# Patient Record
Sex: Male | Born: 1980 | Race: Black or African American | Hispanic: No | Marital: Single | Smoking: Never smoker
Health system: Southern US, Community
[De-identification: ages and names within clinical notes are randomized; demographics above are authoritative.]

---

## 1998-08-09 ENCOUNTER — Emergency Department (HOSPITAL_COMMUNITY): Admission: EM | Admit: 1998-08-09 | Discharge: 1998-08-09 | Payer: Self-pay | Admitting: Emergency Medicine

## 2002-05-22 ENCOUNTER — Emergency Department (HOSPITAL_COMMUNITY): Admission: EM | Admit: 2002-05-22 | Discharge: 2002-05-22 | Payer: Self-pay | Admitting: Emergency Medicine

## 2002-05-22 ENCOUNTER — Encounter: Payer: Self-pay | Admitting: Emergency Medicine

## 2015-06-30 DIAGNOSIS — J029 Acute pharyngitis, unspecified: Secondary | ICD-10-CM | POA: Diagnosis not present

## 2015-06-30 DIAGNOSIS — Z9289 Personal history of other medical treatment: Secondary | ICD-10-CM | POA: Diagnosis not present

## 2015-07-02 DIAGNOSIS — J069 Acute upper respiratory infection, unspecified: Secondary | ICD-10-CM | POA: Diagnosis not present

## 2015-08-19 DIAGNOSIS — L218 Other seborrheic dermatitis: Secondary | ICD-10-CM | POA: Diagnosis not present

## 2015-08-19 DIAGNOSIS — B360 Pityriasis versicolor: Secondary | ICD-10-CM | POA: Diagnosis not present

## 2015-10-05 DIAGNOSIS — Z0000 Encounter for general adult medical examination without abnormal findings: Secondary | ICD-10-CM | POA: Diagnosis not present

## 2015-10-06 DIAGNOSIS — Z23 Encounter for immunization: Secondary | ICD-10-CM | POA: Diagnosis not present

## 2015-10-06 DIAGNOSIS — Z0000 Encounter for general adult medical examination without abnormal findings: Secondary | ICD-10-CM | POA: Diagnosis not present

## 2015-10-06 DIAGNOSIS — E7800 Pure hypercholesterolemia, unspecified: Secondary | ICD-10-CM | POA: Diagnosis not present

## 2016-05-21 DIAGNOSIS — Z0000 Encounter for general adult medical examination without abnormal findings: Secondary | ICD-10-CM | POA: Diagnosis not present

## 2016-07-12 DIAGNOSIS — E7800 Pure hypercholesterolemia, unspecified: Secondary | ICD-10-CM | POA: Diagnosis not present

## 2016-07-12 DIAGNOSIS — E669 Obesity, unspecified: Secondary | ICD-10-CM | POA: Diagnosis not present

## 2016-09-04 DIAGNOSIS — M9901 Segmental and somatic dysfunction of cervical region: Secondary | ICD-10-CM | POA: Diagnosis not present

## 2016-09-04 DIAGNOSIS — M542 Cervicalgia: Secondary | ICD-10-CM | POA: Diagnosis not present

## 2016-09-04 DIAGNOSIS — M2560 Stiffness of unspecified joint, not elsewhere classified: Secondary | ICD-10-CM | POA: Diagnosis not present

## 2016-09-04 DIAGNOSIS — R293 Abnormal posture: Secondary | ICD-10-CM | POA: Diagnosis not present

## 2016-09-14 DIAGNOSIS — M9901 Segmental and somatic dysfunction of cervical region: Secondary | ICD-10-CM | POA: Diagnosis not present

## 2016-09-14 DIAGNOSIS — M2560 Stiffness of unspecified joint, not elsewhere classified: Secondary | ICD-10-CM | POA: Diagnosis not present

## 2016-09-14 DIAGNOSIS — R293 Abnormal posture: Secondary | ICD-10-CM | POA: Diagnosis not present

## 2016-09-14 DIAGNOSIS — M542 Cervicalgia: Secondary | ICD-10-CM | POA: Diagnosis not present

## 2016-09-17 DIAGNOSIS — M2560 Stiffness of unspecified joint, not elsewhere classified: Secondary | ICD-10-CM | POA: Diagnosis not present

## 2016-09-17 DIAGNOSIS — R293 Abnormal posture: Secondary | ICD-10-CM | POA: Diagnosis not present

## 2016-09-17 DIAGNOSIS — M542 Cervicalgia: Secondary | ICD-10-CM | POA: Diagnosis not present

## 2016-09-17 DIAGNOSIS — M9901 Segmental and somatic dysfunction of cervical region: Secondary | ICD-10-CM | POA: Diagnosis not present

## 2016-09-19 DIAGNOSIS — M2560 Stiffness of unspecified joint, not elsewhere classified: Secondary | ICD-10-CM | POA: Diagnosis not present

## 2016-09-19 DIAGNOSIS — M9901 Segmental and somatic dysfunction of cervical region: Secondary | ICD-10-CM | POA: Diagnosis not present

## 2016-09-19 DIAGNOSIS — R293 Abnormal posture: Secondary | ICD-10-CM | POA: Diagnosis not present

## 2016-09-19 DIAGNOSIS — M542 Cervicalgia: Secondary | ICD-10-CM | POA: Diagnosis not present

## 2016-09-21 DIAGNOSIS — M9901 Segmental and somatic dysfunction of cervical region: Secondary | ICD-10-CM | POA: Diagnosis not present

## 2016-09-21 DIAGNOSIS — M542 Cervicalgia: Secondary | ICD-10-CM | POA: Diagnosis not present

## 2016-09-21 DIAGNOSIS — R293 Abnormal posture: Secondary | ICD-10-CM | POA: Diagnosis not present

## 2016-09-21 DIAGNOSIS — M2560 Stiffness of unspecified joint, not elsewhere classified: Secondary | ICD-10-CM | POA: Diagnosis not present

## 2016-09-24 DIAGNOSIS — R293 Abnormal posture: Secondary | ICD-10-CM | POA: Diagnosis not present

## 2016-09-24 DIAGNOSIS — M9901 Segmental and somatic dysfunction of cervical region: Secondary | ICD-10-CM | POA: Diagnosis not present

## 2016-09-24 DIAGNOSIS — M2560 Stiffness of unspecified joint, not elsewhere classified: Secondary | ICD-10-CM | POA: Diagnosis not present

## 2016-09-24 DIAGNOSIS — M542 Cervicalgia: Secondary | ICD-10-CM | POA: Diagnosis not present

## 2016-09-26 DIAGNOSIS — M2560 Stiffness of unspecified joint, not elsewhere classified: Secondary | ICD-10-CM | POA: Diagnosis not present

## 2016-09-26 DIAGNOSIS — M9901 Segmental and somatic dysfunction of cervical region: Secondary | ICD-10-CM | POA: Diagnosis not present

## 2016-09-26 DIAGNOSIS — M542 Cervicalgia: Secondary | ICD-10-CM | POA: Diagnosis not present

## 2016-09-26 DIAGNOSIS — R293 Abnormal posture: Secondary | ICD-10-CM | POA: Diagnosis not present

## 2016-09-28 DIAGNOSIS — M9901 Segmental and somatic dysfunction of cervical region: Secondary | ICD-10-CM | POA: Diagnosis not present

## 2016-09-28 DIAGNOSIS — R293 Abnormal posture: Secondary | ICD-10-CM | POA: Diagnosis not present

## 2016-09-28 DIAGNOSIS — M542 Cervicalgia: Secondary | ICD-10-CM | POA: Diagnosis not present

## 2016-09-28 DIAGNOSIS — M2560 Stiffness of unspecified joint, not elsewhere classified: Secondary | ICD-10-CM | POA: Diagnosis not present

## 2016-10-01 DIAGNOSIS — R293 Abnormal posture: Secondary | ICD-10-CM | POA: Diagnosis not present

## 2016-10-01 DIAGNOSIS — M9901 Segmental and somatic dysfunction of cervical region: Secondary | ICD-10-CM | POA: Diagnosis not present

## 2016-10-01 DIAGNOSIS — M2560 Stiffness of unspecified joint, not elsewhere classified: Secondary | ICD-10-CM | POA: Diagnosis not present

## 2016-10-01 DIAGNOSIS — M542 Cervicalgia: Secondary | ICD-10-CM | POA: Diagnosis not present

## 2016-10-03 DIAGNOSIS — M9901 Segmental and somatic dysfunction of cervical region: Secondary | ICD-10-CM | POA: Diagnosis not present

## 2016-10-03 DIAGNOSIS — M542 Cervicalgia: Secondary | ICD-10-CM | POA: Diagnosis not present

## 2016-10-03 DIAGNOSIS — R293 Abnormal posture: Secondary | ICD-10-CM | POA: Diagnosis not present

## 2016-10-03 DIAGNOSIS — M2560 Stiffness of unspecified joint, not elsewhere classified: Secondary | ICD-10-CM | POA: Diagnosis not present

## 2016-10-05 DIAGNOSIS — M9901 Segmental and somatic dysfunction of cervical region: Secondary | ICD-10-CM | POA: Diagnosis not present

## 2016-10-05 DIAGNOSIS — R293 Abnormal posture: Secondary | ICD-10-CM | POA: Diagnosis not present

## 2016-10-05 DIAGNOSIS — M2560 Stiffness of unspecified joint, not elsewhere classified: Secondary | ICD-10-CM | POA: Diagnosis not present

## 2016-10-05 DIAGNOSIS — M542 Cervicalgia: Secondary | ICD-10-CM | POA: Diagnosis not present

## 2016-10-08 DIAGNOSIS — M542 Cervicalgia: Secondary | ICD-10-CM | POA: Diagnosis not present

## 2016-10-08 DIAGNOSIS — R293 Abnormal posture: Secondary | ICD-10-CM | POA: Diagnosis not present

## 2016-10-08 DIAGNOSIS — M2560 Stiffness of unspecified joint, not elsewhere classified: Secondary | ICD-10-CM | POA: Diagnosis not present

## 2016-10-08 DIAGNOSIS — M9901 Segmental and somatic dysfunction of cervical region: Secondary | ICD-10-CM | POA: Diagnosis not present

## 2016-10-10 DIAGNOSIS — R293 Abnormal posture: Secondary | ICD-10-CM | POA: Diagnosis not present

## 2016-10-10 DIAGNOSIS — M9901 Segmental and somatic dysfunction of cervical region: Secondary | ICD-10-CM | POA: Diagnosis not present

## 2016-10-10 DIAGNOSIS — M2560 Stiffness of unspecified joint, not elsewhere classified: Secondary | ICD-10-CM | POA: Diagnosis not present

## 2016-10-10 DIAGNOSIS — M542 Cervicalgia: Secondary | ICD-10-CM | POA: Diagnosis not present

## 2016-10-15 DIAGNOSIS — R293 Abnormal posture: Secondary | ICD-10-CM | POA: Diagnosis not present

## 2016-10-15 DIAGNOSIS — M542 Cervicalgia: Secondary | ICD-10-CM | POA: Diagnosis not present

## 2016-10-15 DIAGNOSIS — M2560 Stiffness of unspecified joint, not elsewhere classified: Secondary | ICD-10-CM | POA: Diagnosis not present

## 2016-10-15 DIAGNOSIS — M9901 Segmental and somatic dysfunction of cervical region: Secondary | ICD-10-CM | POA: Diagnosis not present

## 2016-10-16 DIAGNOSIS — Z23 Encounter for immunization: Secondary | ICD-10-CM | POA: Diagnosis not present

## 2016-10-17 DIAGNOSIS — M2560 Stiffness of unspecified joint, not elsewhere classified: Secondary | ICD-10-CM | POA: Diagnosis not present

## 2016-10-17 DIAGNOSIS — R293 Abnormal posture: Secondary | ICD-10-CM | POA: Diagnosis not present

## 2016-10-17 DIAGNOSIS — M9901 Segmental and somatic dysfunction of cervical region: Secondary | ICD-10-CM | POA: Diagnosis not present

## 2016-10-17 DIAGNOSIS — M542 Cervicalgia: Secondary | ICD-10-CM | POA: Diagnosis not present

## 2016-10-22 DIAGNOSIS — M9901 Segmental and somatic dysfunction of cervical region: Secondary | ICD-10-CM | POA: Diagnosis not present

## 2016-10-22 DIAGNOSIS — M542 Cervicalgia: Secondary | ICD-10-CM | POA: Diagnosis not present

## 2016-10-22 DIAGNOSIS — R293 Abnormal posture: Secondary | ICD-10-CM | POA: Diagnosis not present

## 2016-10-22 DIAGNOSIS — M2560 Stiffness of unspecified joint, not elsewhere classified: Secondary | ICD-10-CM | POA: Diagnosis not present

## 2016-10-29 DIAGNOSIS — Z008 Encounter for other general examination: Secondary | ICD-10-CM | POA: Diagnosis not present

## 2016-10-31 DIAGNOSIS — Z008 Encounter for other general examination: Secondary | ICD-10-CM | POA: Diagnosis not present

## 2016-11-05 DIAGNOSIS — Z008 Encounter for other general examination: Secondary | ICD-10-CM | POA: Diagnosis not present

## 2016-11-07 DIAGNOSIS — Z008 Encounter for other general examination: Secondary | ICD-10-CM | POA: Diagnosis not present

## 2016-11-12 DIAGNOSIS — Z008 Encounter for other general examination: Secondary | ICD-10-CM | POA: Diagnosis not present

## 2016-11-14 DIAGNOSIS — Z008 Encounter for other general examination: Secondary | ICD-10-CM | POA: Diagnosis not present

## 2016-11-16 DIAGNOSIS — Z23 Encounter for immunization: Secondary | ICD-10-CM | POA: Diagnosis not present

## 2016-11-21 DIAGNOSIS — Z008 Encounter for other general examination: Secondary | ICD-10-CM | POA: Diagnosis not present

## 2016-12-03 DIAGNOSIS — Z008 Encounter for other general examination: Secondary | ICD-10-CM | POA: Diagnosis not present

## 2016-12-05 DIAGNOSIS — Z008 Encounter for other general examination: Secondary | ICD-10-CM | POA: Diagnosis not present

## 2016-12-12 DIAGNOSIS — Z008 Encounter for other general examination: Secondary | ICD-10-CM | POA: Diagnosis not present

## 2016-12-19 DIAGNOSIS — Z008 Encounter for other general examination: Secondary | ICD-10-CM | POA: Diagnosis not present

## 2017-01-11 DIAGNOSIS — R0602 Shortness of breath: Secondary | ICD-10-CM | POA: Diagnosis not present

## 2017-01-11 DIAGNOSIS — R509 Fever, unspecified: Secondary | ICD-10-CM | POA: Diagnosis not present

## 2017-01-11 DIAGNOSIS — J209 Acute bronchitis, unspecified: Secondary | ICD-10-CM | POA: Diagnosis not present

## 2017-01-11 DIAGNOSIS — R05 Cough: Secondary | ICD-10-CM | POA: Diagnosis not present

## 2017-01-21 DIAGNOSIS — E7800 Pure hypercholesterolemia, unspecified: Secondary | ICD-10-CM | POA: Diagnosis not present

## 2017-01-21 DIAGNOSIS — Z0000 Encounter for general adult medical examination without abnormal findings: Secondary | ICD-10-CM | POA: Diagnosis not present

## 2017-02-22 DIAGNOSIS — R748 Abnormal levels of other serum enzymes: Secondary | ICD-10-CM | POA: Diagnosis not present

## 2017-03-18 DIAGNOSIS — Z23 Encounter for immunization: Secondary | ICD-10-CM | POA: Diagnosis not present

## 2017-06-21 DIAGNOSIS — M67911 Unspecified disorder of synovium and tendon, right shoulder: Secondary | ICD-10-CM | POA: Diagnosis not present

## 2017-06-21 DIAGNOSIS — M67912 Unspecified disorder of synovium and tendon, left shoulder: Secondary | ICD-10-CM | POA: Diagnosis not present

## 2018-01-23 DIAGNOSIS — Z0000 Encounter for general adult medical examination without abnormal findings: Secondary | ICD-10-CM | POA: Diagnosis not present

## 2018-01-23 DIAGNOSIS — E7800 Pure hypercholesterolemia, unspecified: Secondary | ICD-10-CM | POA: Diagnosis not present

## 2018-08-20 DIAGNOSIS — M40292 Other kyphosis, cervical region: Secondary | ICD-10-CM | POA: Diagnosis not present

## 2018-08-20 DIAGNOSIS — M21752 Unequal limb length (acquired), left femur: Secondary | ICD-10-CM | POA: Diagnosis not present

## 2018-08-20 DIAGNOSIS — M5441 Lumbago with sciatica, right side: Secondary | ICD-10-CM | POA: Diagnosis not present

## 2018-08-20 DIAGNOSIS — M9901 Segmental and somatic dysfunction of cervical region: Secondary | ICD-10-CM | POA: Diagnosis not present

## 2018-08-22 DIAGNOSIS — M9901 Segmental and somatic dysfunction of cervical region: Secondary | ICD-10-CM | POA: Diagnosis not present

## 2018-08-22 DIAGNOSIS — M21752 Unequal limb length (acquired), left femur: Secondary | ICD-10-CM | POA: Diagnosis not present

## 2018-08-22 DIAGNOSIS — M5441 Lumbago with sciatica, right side: Secondary | ICD-10-CM | POA: Diagnosis not present

## 2018-08-22 DIAGNOSIS — M40292 Other kyphosis, cervical region: Secondary | ICD-10-CM | POA: Diagnosis not present

## 2018-08-25 DIAGNOSIS — M5441 Lumbago with sciatica, right side: Secondary | ICD-10-CM | POA: Diagnosis not present

## 2018-08-25 DIAGNOSIS — M9901 Segmental and somatic dysfunction of cervical region: Secondary | ICD-10-CM | POA: Diagnosis not present

## 2018-08-25 DIAGNOSIS — M40292 Other kyphosis, cervical region: Secondary | ICD-10-CM | POA: Diagnosis not present

## 2018-08-25 DIAGNOSIS — M21752 Unequal limb length (acquired), left femur: Secondary | ICD-10-CM | POA: Diagnosis not present

## 2018-08-27 DIAGNOSIS — M9901 Segmental and somatic dysfunction of cervical region: Secondary | ICD-10-CM | POA: Diagnosis not present

## 2018-08-27 DIAGNOSIS — M21752 Unequal limb length (acquired), left femur: Secondary | ICD-10-CM | POA: Diagnosis not present

## 2018-08-27 DIAGNOSIS — M40292 Other kyphosis, cervical region: Secondary | ICD-10-CM | POA: Diagnosis not present

## 2018-08-27 DIAGNOSIS — M5441 Lumbago with sciatica, right side: Secondary | ICD-10-CM | POA: Diagnosis not present

## 2018-08-29 DIAGNOSIS — M21752 Unequal limb length (acquired), left femur: Secondary | ICD-10-CM | POA: Diagnosis not present

## 2018-08-29 DIAGNOSIS — M5441 Lumbago with sciatica, right side: Secondary | ICD-10-CM | POA: Diagnosis not present

## 2018-08-29 DIAGNOSIS — M40292 Other kyphosis, cervical region: Secondary | ICD-10-CM | POA: Diagnosis not present

## 2018-08-29 DIAGNOSIS — Z20828 Contact with and (suspected) exposure to other viral communicable diseases: Secondary | ICD-10-CM | POA: Diagnosis not present

## 2018-08-29 DIAGNOSIS — M9901 Segmental and somatic dysfunction of cervical region: Secondary | ICD-10-CM | POA: Diagnosis not present

## 2018-09-08 DIAGNOSIS — M5441 Lumbago with sciatica, right side: Secondary | ICD-10-CM | POA: Diagnosis not present

## 2018-09-08 DIAGNOSIS — M9901 Segmental and somatic dysfunction of cervical region: Secondary | ICD-10-CM | POA: Diagnosis not present

## 2018-09-08 DIAGNOSIS — M21752 Unequal limb length (acquired), left femur: Secondary | ICD-10-CM | POA: Diagnosis not present

## 2018-09-08 DIAGNOSIS — M40292 Other kyphosis, cervical region: Secondary | ICD-10-CM | POA: Diagnosis not present

## 2018-09-12 DIAGNOSIS — M24812 Other specific joint derangements of left shoulder, not elsewhere classified: Secondary | ICD-10-CM | POA: Diagnosis not present

## 2018-11-23 DIAGNOSIS — Z20828 Contact with and (suspected) exposure to other viral communicable diseases: Secondary | ICD-10-CM | POA: Diagnosis not present

## 2018-12-23 DIAGNOSIS — Z20828 Contact with and (suspected) exposure to other viral communicable diseases: Secondary | ICD-10-CM | POA: Diagnosis not present

## 2019-01-17 DIAGNOSIS — B029 Zoster without complications: Secondary | ICD-10-CM | POA: Diagnosis not present

## 2019-01-17 DIAGNOSIS — Z20822 Contact with and (suspected) exposure to covid-19: Secondary | ICD-10-CM | POA: Diagnosis not present

## 2019-02-15 DIAGNOSIS — Z20822 Contact with and (suspected) exposure to covid-19: Secondary | ICD-10-CM | POA: Diagnosis not present

## 2019-02-18 ENCOUNTER — Other Ambulatory Visit: Payer: Self-pay

## 2019-02-18 ENCOUNTER — Ambulatory Visit (INDEPENDENT_AMBULATORY_CARE_PROVIDER_SITE_OTHER): Payer: BLUE CROSS/BLUE SHIELD | Admitting: Primary Care

## 2019-02-18 ENCOUNTER — Encounter (INDEPENDENT_AMBULATORY_CARE_PROVIDER_SITE_OTHER): Payer: Self-pay | Admitting: Primary Care

## 2019-02-18 DIAGNOSIS — Z7689 Persons encountering health services in other specified circumstances: Secondary | ICD-10-CM | POA: Diagnosis not present

## 2019-02-18 NOTE — Progress Notes (Signed)
Virtual Visit via Telephone Note  I connected with Eric Horton on 02/18/19 at 10:30 AM EST by telephone and verified that I am speaking with the correct person using two identifiers.   I discussed the limitations, risks, security and privacy concerns of performing an evaluation and management service by telephone and the availability of in person appointments. I also discussed with the patient that there may be a patient responsible charge related to this service. The patient expressed understanding and agreed to proceed.   History of Present Illness: Eric Horton is having a tele visit to establish care. He voices concerns that he would like a physical with labs and requesting a shingles vaccine. He voices no other complaints or concerns.  Past medical history per patient shingles  No current outpatient medications on file prior to visit.   No current facility-administered medications on file prior to visit.   Observations/Objective: Review of Systems  All other systems reviewed and are negative.   Assessment and Plan: Eric Horton was seen today for new patient (initial visit).  Diagnoses and all orders for this visit:  Encounter to establish care Gwinda Passe, NP-C will be your  (PCP) she is mastered prepared . She is skilled to diagnosed and treat illness. Also able to answer health concern as well as continuing care of varied medical conditions, not limited by cause, organ system, or diagnosis.      Follow Up Instructions: Physical and shingles vaccine   I discussed the assessment and treatment plan with the patient. The patient was provided an opportunity to ask questions and all were answered. The patient agreed with the plan and demonstrated an understanding of the instructions.   The patient was advised to call back or seek an in-person evaluation if the symptoms worsen or if the condition fails to improve as anticipated.  I provided  minutes 10 of  non-face-to-face time during this encounter. Reviewed previous encounters, no previous labs , procedure but 2004 imaging    Eric Sessions, NP

## 2019-02-23 ENCOUNTER — Ambulatory Visit (INDEPENDENT_AMBULATORY_CARE_PROVIDER_SITE_OTHER): Payer: Self-pay | Admitting: Primary Care

## 2019-02-23 DIAGNOSIS — Z20828 Contact with and (suspected) exposure to other viral communicable diseases: Secondary | ICD-10-CM | POA: Diagnosis not present

## 2019-02-23 DIAGNOSIS — Z03818 Encounter for observation for suspected exposure to other biological agents ruled out: Secondary | ICD-10-CM | POA: Diagnosis not present

## 2019-03-05 ENCOUNTER — Ambulatory Visit (INDEPENDENT_AMBULATORY_CARE_PROVIDER_SITE_OTHER): Payer: BLUE CROSS/BLUE SHIELD | Admitting: Primary Care

## 2019-03-05 ENCOUNTER — Other Ambulatory Visit: Payer: Self-pay

## 2019-03-05 ENCOUNTER — Encounter (INDEPENDENT_AMBULATORY_CARE_PROVIDER_SITE_OTHER): Payer: Self-pay | Admitting: Primary Care

## 2019-03-05 VITALS — BP 132/76 | HR 62 | Temp 97.3°F | Ht 73.0 in | Wt 244.0 lb

## 2019-03-05 DIAGNOSIS — Z8619 Personal history of other infectious and parasitic diseases: Secondary | ICD-10-CM

## 2019-03-05 DIAGNOSIS — Z125 Encounter for screening for malignant neoplasm of prostate: Secondary | ICD-10-CM

## 2019-03-05 DIAGNOSIS — Z114 Encounter for screening for human immunodeficiency virus [HIV]: Secondary | ICD-10-CM

## 2019-03-05 DIAGNOSIS — R6882 Decreased libido: Secondary | ICD-10-CM | POA: Diagnosis not present

## 2019-03-05 DIAGNOSIS — Z23 Encounter for immunization: Secondary | ICD-10-CM | POA: Diagnosis not present

## 2019-03-05 DIAGNOSIS — R030 Elevated blood-pressure reading, without diagnosis of hypertension: Secondary | ICD-10-CM | POA: Diagnosis not present

## 2019-03-05 DIAGNOSIS — Z7689 Persons encountering health services in other specified circumstances: Secondary | ICD-10-CM

## 2019-03-05 DIAGNOSIS — Z6832 Body mass index (BMI) 32.0-32.9, adult: Secondary | ICD-10-CM

## 2019-03-05 DIAGNOSIS — Z2809 Immunization not carried out because of other contraindication: Secondary | ICD-10-CM

## 2019-03-05 NOTE — Patient Instructions (Signed)

## 2019-03-05 NOTE — Progress Notes (Signed)
New Patient Office Visit  Subjective:  Patient ID: JAE SKEET, male    DOB: [birth year]  Age: 39 y.o. MRN: [id number]  CC:  Chief Complaint  Patient presents with  . Annual Exam    HPI Eric Horton presents for annual physical. No complaints or concerns at this time  No past medical history on file.  No family history on file.  Social History   Socioeconomic History  . Marital status: Single    Spouse name: Not on file  . Number of children: Not on file  . Years of education: Not on file  . Highest education level: Not on file  Occupational History  . Not on file  Tobacco Use  . Smoking status: Never Smoker  . Smokeless tobacco: Never Used  Substance and Sexual Activity  . Alcohol use: Yes    Comment: occassionally   . Drug use: Never  . Sexual activity: Yes    Partners: Female  Other Topics Concern  . Not on file  Social History Narrative  . Not on file   Social Determinants of Health   Financial Resource Strain:   . Difficulty of Paying Living Expenses: Not on file  Food Insecurity:   . Worried About Programme researcher, broadcasting/film/video in the Last Year: Not on file  . Ran Out of Food in the Last Year: Not on file  Transportation Needs:   . Lack of Transportation (Medical): Not on file  . Lack of Transportation (Non-Medical): Not on file  Physical Activity:   . Days of Exercise per Week: Not on file  . Minutes of Exercise per Session: Not on file  Stress:   . Feeling of Stress : Not on file  Social Connections:   . Frequency of Communication with Friends and Family: Not on file  . Frequency of Social Gatherings with Friends and Family: Not on file  . Attends Religious Services: Not on file  . Active Member of Clubs or Organizations: Not on file  . Attends Banker Meetings: Not on file  . Marital Status: Not on file  Intimate Partner Violence:   . Fear of Current or Ex-Partner: Not on file  . Emotionally Abused: Not on file  . Physically  Abused: Not on file  . Sexually Abused: Not on file    ROS Review of Systems  Psychiatric/Behavioral: Positive for sleep disturbance.  All other systems reviewed and are negative.   Objective:   Today's Vitals: BP 132/76 (BP Location: Left Arm, Patient Position: Sitting, Cuff Size: Large)   Pulse 62   Temp (!) 97.3 F (36.3 C) (Temporal)   Ht 6\' 1"  (1.854 m)   Wt 244 lb (110.7 kg)   SpO2 95%   BMI 32.19 kg/m   Physical Exam Vitals reviewed.  Constitutional:      Appearance: Normal appearance. He is obese.  HENT:     Head: Normocephalic and atraumatic.     Right Ear: Tympanic membrane normal.     Left Ear: Tympanic membrane normal.     Nose: Nose normal.  Eyes:     Extraocular Movements: Extraocular movements intact.     Pupils: Pupils are equal, round, and reactive to light.  Cardiovascular:     Rate and Rhythm: Normal rate and regular rhythm.  Pulmonary:     Effort: Pulmonary effort is normal.     Breath sounds: Normal breath sounds.  Abdominal:     General: Bowel sounds are normal.  Musculoskeletal:  General: Normal range of motion.  Skin:    General: Skin is warm and dry.  Neurological:     General: No focal deficit present.     Mental Status: He is alert and oriented to person, place, and time.  Psychiatric:        Mood and Affect: Mood normal.        Behavior: Behavior normal.        Thought Content: Thought content normal.        Judgment: Judgment normal.     Assessment & Plan:  Eric Horton was seen today for annual exam.  Diagnoses and all orders for this visit:  Encounter to establish care Juluis Mire, NP-C will be your  (PCP) she is mastered prepared . She is skilled to diagnosed and treat illness. Also able to answer health concern as well as continuing care of varied medical conditions, not limited by cause, organ system, or diagnosis.   Prostate cancer screening Health maintenance  -     PSA  Elevated blood pressure reading without  diagnosis of hypertension Counseled on blood pressure goal of less than 130/80, low-sodium, DASH diet, medication compliance, 150 minutes of moderate intensity exercise per week. Life style modification diet and exercise 2 additional visit before dx of HTN  Class 2 severe obesity due to excess calories with serious comorbidity in adult, unspecified BMI ([place]) Obesity is 30-39 indicating an excess in caloric intake or underlining conditions. This may lead to other co-morbidities risk for diabetes, hypertension and respiratory issues. Lifestyle modifications of diet and exercise may reduce obesity.   Encounter for screening for HIV Health maintenance and quality metrics  -     HIV Antibody (routine testing w rflx)  Low libido -     Testosterone  23-polyvalent pneumococcal polysaccharide vaccine contraindicated as received shingles vaccine within last 4 weeks. HERPES ZOSTER VIRUS VACCINE is used to prevent shingles in adults 41 years old and over. This vaccine is not used to treat shingles or nerve pain from shingles. You are aware that this will not prevent another out break but lessons the side affects   History of shingles -     Cancel: Varicella-zoster vaccine IM (Shingrix) -     Varicella-zoster vaccine IM (Shingrix)  Other orders -     Cancel: Varicella-zoster vaccine subcutaneous   Problem List Items Addressed This Visit    None    Visit Diagnoses    Encounter to establish care    -  Primary   Prostate cancer screening       Relevant Orders   PSA (Completed)   Elevated blood pressure reading without diagnosis of hypertension       Class 2 severe obesity due to excess calories with serious comorbidity in adult, unspecified BMI ([place])       Encounter for screening for HIV       Relevant Orders   HIV Antibody (routine testing w rflx) (Completed)   Low libido       Relevant Orders   Testosterone (Completed)   23-polyvalent pneumococcal polysaccharide vaccine contraindicated as  received shingles vaccine within last 4 weeks       History of shingles       Relevant Orders   Varicella-zoster vaccine IM (Shingrix) (Completed)      No outpatient encounter medications on file as of 03/05/2019.   No facility-administered encounter medications on file as of 03/05/2019.    Follow-up: Return if symptoms worsen or fail to improve.  Kerin Perna, NP

## 2019-03-06 LAB — PSA: Prostate Specific Ag, Serum: 1.7 ng/mL (ref 0.0–4.0)

## 2019-03-06 LAB — HIV ANTIBODY (ROUTINE TESTING W REFLEX): HIV Screen 4th Generation wRfx: NONREACTIVE

## 2019-03-06 LAB — TESTOSTERONE: Testosterone: 635 ng/dL (ref 264–916)

## 2019-03-09 ENCOUNTER — Telehealth (INDEPENDENT_AMBULATORY_CARE_PROVIDER_SITE_OTHER): Payer: Self-pay

## 2019-03-09 NOTE — Telephone Encounter (Signed)
-----   Message from Grayce Sessions, NP sent at 03/06/2019 11:12 AM EST ----- All labs are normal PSA , HIV negative and testosterone levels are great

## 2019-03-09 NOTE — Telephone Encounter (Signed)
Patient verified date of birth. He is aware that all labs are normal. Tempestt S Roberts, CMA  

## 2019-03-25 ENCOUNTER — Other Ambulatory Visit: Payer: Self-pay

## 2019-03-25 ENCOUNTER — Encounter (INDEPENDENT_AMBULATORY_CARE_PROVIDER_SITE_OTHER): Payer: Self-pay | Admitting: Primary Care

## 2019-03-25 ENCOUNTER — Ambulatory Visit (INDEPENDENT_AMBULATORY_CARE_PROVIDER_SITE_OTHER): Payer: BLUE CROSS/BLUE SHIELD | Admitting: Primary Care

## 2019-03-25 VITALS — BP 142/88 | HR 69 | Temp 97.3°F | Ht 73.0 in | Wt 249.6 lb

## 2019-03-25 DIAGNOSIS — Z0000 Encounter for general adult medical examination without abnormal findings: Secondary | ICD-10-CM

## 2019-03-25 DIAGNOSIS — R030 Elevated blood-pressure reading, without diagnosis of hypertension: Secondary | ICD-10-CM

## 2019-04-02 DIAGNOSIS — Z20822 Contact with and (suspected) exposure to covid-19: Secondary | ICD-10-CM | POA: Diagnosis not present

## 2019-04-05 NOTE — Progress Notes (Signed)
Vitals:   03/25/19 1333  BP: (!) 142/88  Pulse: 69  Temp: (!) 97.3 F (36.3 C)  TempSrc: Temporal  SpO2: 98%  Weight: 249 lb 9.6 oz (113.2 kg)  Height: 6\' 1"  (1.854 m)  Review of Systems  All other systems reviewed and are negative.  General: Vital signs reviewed.  Patient is well-developed and well-nourished, in no acute distress and cooperative with exam.  Head: Normocephalic and atraumatic. Eyes: EOMI, conjunctivae normal, no scleral icterus.  Neck: Supple, trachea midline, normal ROM, no JVD, masses, thyromegaly, or carotid bruit present.  Cardiovascular: RRR, S1 normal, S2 normal, no murmurs, gallops, or rubs. Pulmonary/Chest: Clear to auscultation bilaterally, no wheezes, rales, or rhonchi. Abdominal: Soft, non-tender, non-distended, BS +, no masses, organomegaly, or guarding present.  Musculoskeletal: No joint deformities, erythema, or stiffness, ROM full and nontender. Extremities: No lower extremity edema bilaterally,  pulses symmetric and intact bilaterally. No cyanosis or clubbing. Neurological: A&O x3, Strength is normal and symmetric bilaterally, cranial nerve II-XII are grossly intact, no focal motor deficit, sensory intact to light touch bilaterally.  Skin: Warm, dry and intact. No rashes or erythema. Psychiatric: Normal mood and affect. speech and behavior is normal.  Cognition and memory are normal. Eric Horton was seen today for eye exam.  Diagnoses and all orders for this visit:  Annual physical exam -     PR RHYTHM ECG WITH REPORT -     EKG 12-Lead   Elevated blood pressure reading without diagnosis of hypertension Goal BP:  For patients younger than 60: Goal BP < 130/80 For patients 60 and older: Goal BP < 150/90. For patients with diabetes: Goal BP < 140/90. Your most recent BP: 142/88 mmHg.  Take your medications faithfully as instructed. Maintain a healthy weight. Get at least 150 minutes of aerobic exercise per week. Minimize salt intake. Minimize  alcohol intake   Class 2 severe obesity due to excess calories with serious comorbidity in adult, unspecified BMI ([facility]) Obesity is 30-39 indicating an excess in caloric intake or underlining conditions. This may lead to other co-morbidities- hypertension, cardiovascular disease, and respiratory complications. Lifestyle modifications of diet and exercise may reduce obesity.

## 2019-04-15 DIAGNOSIS — Z20828 Contact with and (suspected) exposure to other viral communicable diseases: Secondary | ICD-10-CM | POA: Diagnosis not present

## 2019-09-06 DIAGNOSIS — S6992XA Unspecified injury of left wrist, hand and finger(s), initial encounter: Secondary | ICD-10-CM | POA: Diagnosis not present

## 2019-09-06 DIAGNOSIS — M79642 Pain in left hand: Secondary | ICD-10-CM | POA: Diagnosis not present

## 2019-09-06 DIAGNOSIS — W501XXA Accidental kick by another person, initial encounter: Secondary | ICD-10-CM | POA: Diagnosis not present

## 2019-09-06 DIAGNOSIS — M25532 Pain in left wrist: Secondary | ICD-10-CM | POA: Diagnosis not present

## 2019-09-09 DIAGNOSIS — S5332XA Traumatic rupture of left ulnar collateral ligament, initial encounter: Secondary | ICD-10-CM | POA: Diagnosis not present

## 2019-09-09 DIAGNOSIS — S5332XD Traumatic rupture of left ulnar collateral ligament, subsequent encounter: Secondary | ICD-10-CM | POA: Diagnosis not present

## 2019-09-09 DIAGNOSIS — M79645 Pain in left finger(s): Secondary | ICD-10-CM | POA: Diagnosis not present

## 2019-09-15 ENCOUNTER — Other Ambulatory Visit: Payer: Self-pay | Admitting: Orthopedic Surgery

## 2019-09-15 DIAGNOSIS — S5332XS Traumatic rupture of left ulnar collateral ligament, sequela: Secondary | ICD-10-CM

## 2019-09-15 DIAGNOSIS — S63642S Sprain of metacarpophalangeal joint of left thumb, sequela: Secondary | ICD-10-CM

## 2019-09-28 ENCOUNTER — Other Ambulatory Visit: Payer: BLUE CROSS/BLUE SHIELD

## 2019-10-01 ENCOUNTER — Other Ambulatory Visit: Payer: Self-pay

## 2019-10-01 ENCOUNTER — Ambulatory Visit
Admission: RE | Admit: 2019-10-01 | Discharge: 2019-10-01 | Disposition: A | Discharge status: Home / Self Care | Payer: BLUE CROSS/BLUE SHIELD | Source: Ambulatory Visit | Attending: Orthopedic Surgery | Admitting: Orthopedic Surgery

## 2019-10-01 DIAGNOSIS — S5332XS Traumatic rupture of left ulnar collateral ligament, sequela: Secondary | ICD-10-CM

## 2019-10-01 DIAGNOSIS — S63642S Sprain of metacarpophalangeal joint of left thumb, sequela: Secondary | ICD-10-CM

## 2019-10-01 DIAGNOSIS — S6992XA Unspecified injury of left wrist, hand and finger(s), initial encounter: Secondary | ICD-10-CM | POA: Diagnosis not present

## 2019-10-01 DIAGNOSIS — Z043 Encounter for examination and observation following other accident: Secondary | ICD-10-CM | POA: Diagnosis not present

## 2019-10-01 DIAGNOSIS — S60939A Unspecified superficial injury of unspecified thumb, initial encounter: Secondary | ICD-10-CM | POA: Diagnosis not present

## 2019-10-01 MED ORDER — IOPAMIDOL (ISOVUE-M 200) INJECTION 41%
1.0000 mL | Freq: Once | INTRAMUSCULAR | Status: AC
  Administered 2019-10-01: 1 mL via INTRA_ARTICULAR

## 2019-10-05 DIAGNOSIS — S5332XA Traumatic rupture of left ulnar collateral ligament, initial encounter: Secondary | ICD-10-CM | POA: Diagnosis not present

## 2019-11-11 DIAGNOSIS — Z20822 Contact with and (suspected) exposure to covid-19: Secondary | ICD-10-CM | POA: Diagnosis not present

## 2020-09-14 DIAGNOSIS — A630 Anogenital (venereal) warts: Secondary | ICD-10-CM | POA: Diagnosis not present

## 2020-09-14 DIAGNOSIS — Z113 Encounter for screening for infections with a predominantly sexual mode of transmission: Secondary | ICD-10-CM | POA: Diagnosis not present

## 2020-10-24 DIAGNOSIS — Z23 Encounter for immunization: Secondary | ICD-10-CM | POA: Diagnosis not present

## 2021-01-20 ENCOUNTER — Encounter (INDEPENDENT_AMBULATORY_CARE_PROVIDER_SITE_OTHER): Payer: BLUE CROSS/BLUE SHIELD | Admitting: Primary Care

## 2021-08-06 IMAGING — MR MR [PERSON_NAME]*[PERSON_NAME]* W/CM
8 series · 40 of 40 positions shown · IV contrast (agent unspecified)
Comparison: Injection images same date.

CLINICAL DATA: Hyperextension injury a few weeks prior.
Gamekeeper's thumb.

EXAM:
MRI OF THE LEFT FINGERS WITH CONTRAST (MR Arthrogram)
TECHNIQUE: Multiplanar, multisequence MR imaging of the left thumb was
performed following intra-articular contrast injection under
fluoroscopic guidance. No intravenous contrast was administered.

[Series 6: T1 · sagittal · left · 1.5mm · 0.44mm/px · 4 of 20 slices shown]
[im 1/20]
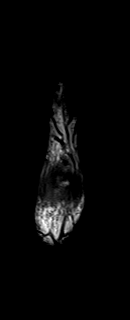
[im 7/20]
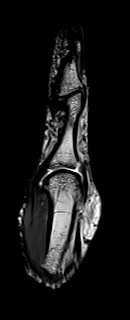
[im 13/20]
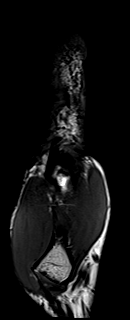
[im 20/20]
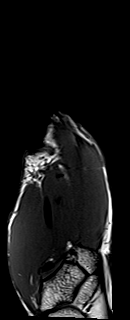

[Series 7: T2 fat-sat · sagittal · left · 1.5mm · 0.44mm/px · 4 of 20 slices shown (1 of 3)]
[im 1/20]
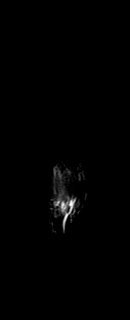
[im 7/20]
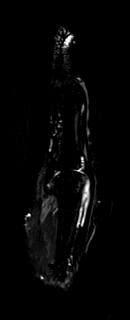
[im 13/20]
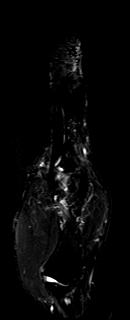
[im 20/20]
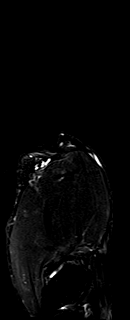

[Series 8: T1 fat-sat · axial · left · 3.0mm · 0.52mm/px · z∈[-55,+70]mm · 7 of 43 slices shown (1 of 3)]
[im 1/43]
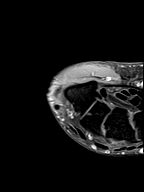
[im 8/43]
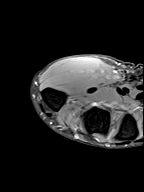
[im 15/43]
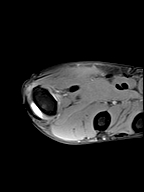
[im 22/43]
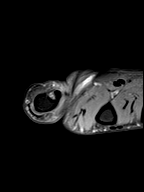
[im 29/43]
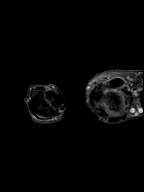
[im 36/43]
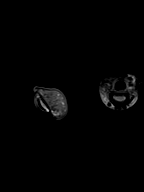
[im 43/43]
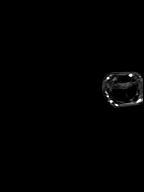

[Series 9: T2 fat-sat · axial · left · 3.0mm · 0.52mm/px · z∈[-55,+70]mm · 7 of 43 slices shown (2 of 3)]
[im 1/43]
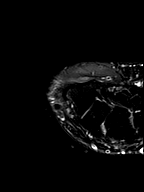
[im 8/43]
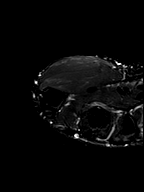
[im 15/43]
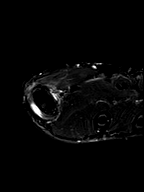
[im 22/43]
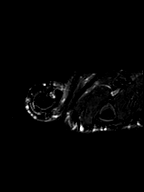
[im 29/43]
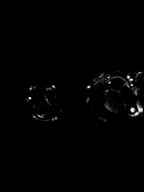
[im 36/43]
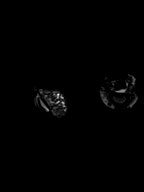
[im 43/43]
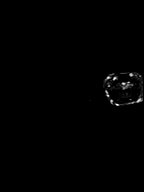

[Series 10: T1 fat-sat · coronal · left · 1.5mm · 0.44mm/px · 3 of 20 slices shown (2 of 3)]
[im 1/20]
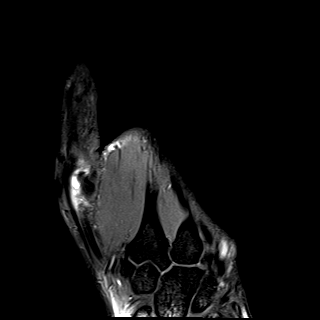
[im 10/20]
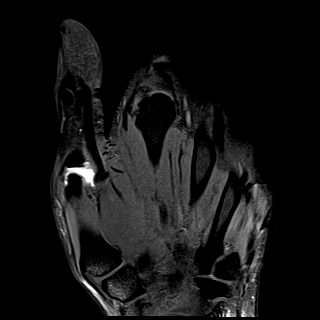
[im 20/20]
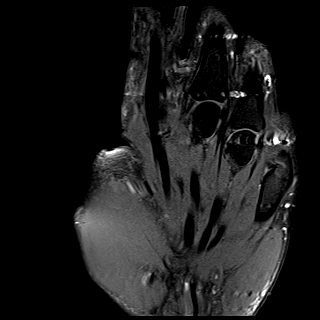

[Series 11: PD fat-sat · coronal · left · 1.5mm · 0.44mm/px · 3 of 20 slices shown]
[im 1/20]
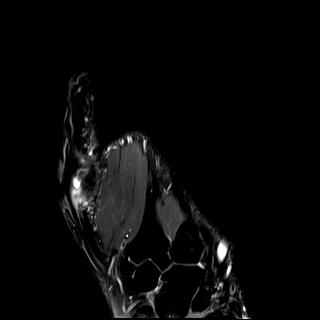
[im 10/20]
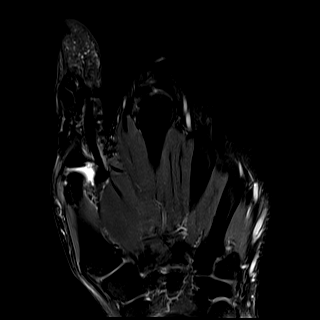
[im 20/20]
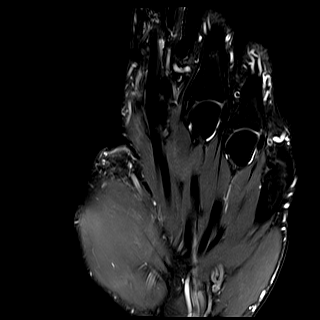

[Series 12: T1 fat-sat · axial · left · 1.0mm · 0.47mm/px · z∈[-22,+13]mm · 6 of 36 slices shown (3 of 3)]
[im 1/36]
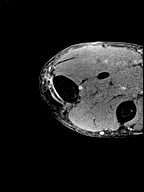
[im 8/36]
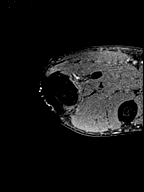
[im 15/36]
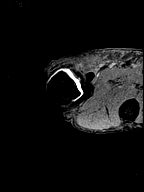
[im 22/36]
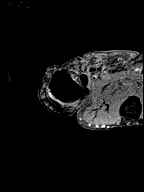
[im 29/36]
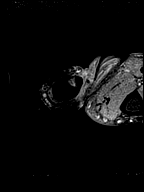
[im 36/36]
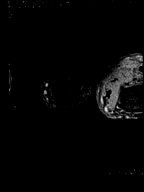

[Series 13: T2 fat-sat · axial · left · 1.0mm · 0.47mm/px · z∈[-23,+11]mm · 6 of 36 slices shown (3 of 3)]
[im 1/36]
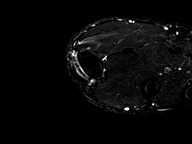
[im 8/36]
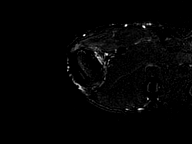
[im 15/36]
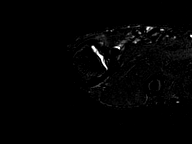
[im 22/36]
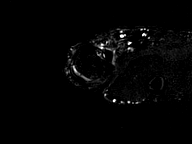
[im 29/36]
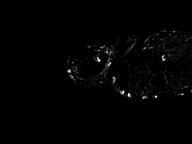
[im 36/36]
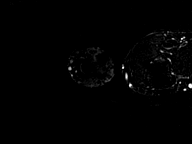

[40 of 40 positions shown; findings below may reference images not displayed]

FINDINGS: Bones/Joint/Cartilage

Mild T2 hyperintensity dorsally in the 1st metacarpal head is
favored to be degenerative. No discrete fracture or focal chondral
defect identified. The 1st metacarpal phalangeal joint is adequately
distended with contrast. There is mild dorsal leakage of contrast at
the injection site.

Ligaments

The collateral ligaments of the 1st metacarpal phalangeal joint
appear intact. Specifically, the ulnar collateral ligament appears
intact.

Muscles and Tendons
The thumb tendons are intact without significant tenosynovitis.
There is mild T2 hyperintensity within the abductor pollicis brevis
muscle which could reflect muscular strain or contusion.

Soft tissues
No focal extra-articular fluid collections or foreign bodies.
IMPRESSION: 1. No evidence of ulnar collateral ligament tear.
2. Mild T2 hyperintensity dorsally in the 1st metacarpal head is
favored to be degenerative. No discrete fracture or focal chondral
defect identified.
3. Mild T2 hyperintensity within the abductor pollicis brevis muscle
which could reflect muscular strain or contusion.

## 2022-01-12 ENCOUNTER — Telehealth (INDEPENDENT_AMBULATORY_CARE_PROVIDER_SITE_OTHER): Payer: Self-pay | Admitting: Primary Care

## 2022-01-12 NOTE — Telephone Encounter (Signed)
Attempted to reach patient to inform her of the visit on Monday would be virtual because provider is not in the office. Left a voicemail message.

## 2022-01-15 ENCOUNTER — Telehealth (INDEPENDENT_AMBULATORY_CARE_PROVIDER_SITE_OTHER): Payer: BC Managed Care – PPO | Admitting: Primary Care

## 2022-01-21 NOTE — Progress Notes (Unsigned)
Patient is a 42 y.o. year old male here for sports physical.  Patient plans to play ***.  Reports no current complaints.  Denies chest pain, shortness of breath, passing out with exercise.  No medical problems.  No family history of heart disease or sudden death before age 41.   Vision *** Blood pressure normal for age and height ***  No past medical history on file.  No current outpatient medications on file prior to visit.   No current facility-administered medications on file prior to visit.    *** The histories are not reviewed yet. Please review them in the "History" navigator section and refresh this [place].  No Known Allergies  Social History   Socioeconomic History   Marital status: Single    Spouse name: Not on file   Number of children: Not on file   Years of education: Not on file   Highest education level: Not on file  Occupational History   Not on file  Tobacco Use   Smoking status: Never   Smokeless tobacco: Never  Substance and Sexual Activity   Alcohol use: Yes    Comment: occassionally    Drug use: Never   Sexual activity: Yes    Partners: Female  Other Topics Concern   Not on file  Social History Narrative   Not on file   Social Determinants of Health   Financial Resource Strain: Not on file  Food Insecurity: Not on file  Transportation Needs: Not on file  Physical Activity: Not on file  Stress: Not on file  Social Connections: Not on file  Intimate Partner Violence: Not on file    No family history on file.  There were no vitals taken for this visit.  Review of Systems: See HPI above.  Physical Exam: Gen: NAD CV: RRR no MRG Lungs: CTAB MSK: FROM and strength all joints and muscle groups.  No evidence scoliosis.  Assessment/Plan: 1. Sports physical: Cleared for all sports without restrictions.

## 2022-01-22 ENCOUNTER — Ambulatory Visit (INDEPENDENT_AMBULATORY_CARE_PROVIDER_SITE_OTHER): Payer: BC Managed Care – PPO | Admitting: Family Medicine

## 2022-01-22 VITALS — BP 134/90 | HR 74 | Ht 73.5 in | Wt 262.0 lb

## 2022-01-22 DIAGNOSIS — Z025 Encounter for examination for participation in sport: Secondary | ICD-10-CM

## 2022-01-22 NOTE — Patient Instructions (Signed)
[facility] [employer] [facility] [employer] [facility]  See me when you need me

## 2022-01-29 ENCOUNTER — Encounter (INDEPENDENT_AMBULATORY_CARE_PROVIDER_SITE_OTHER): Payer: BC Managed Care – PPO | Admitting: Primary Care

## 2022-02-13 ENCOUNTER — Ambulatory Visit: Payer: BC Managed Care – PPO | Admitting: Family Medicine

## 2023-04-14 ENCOUNTER — Other Ambulatory Visit: Payer: Self-pay

## 2023-04-14 ENCOUNTER — Emergency Department (HOSPITAL_BASED_OUTPATIENT_CLINIC_OR_DEPARTMENT_OTHER)
Admission: EM | Admit: 2023-04-14 | Discharge: 2023-04-14 | Disposition: A | Discharge status: Home / Self Care | Attending: Emergency Medicine | Admitting: Emergency Medicine

## 2023-04-14 ENCOUNTER — Encounter (HOSPITAL_BASED_OUTPATIENT_CLINIC_OR_DEPARTMENT_OTHER): Payer: Self-pay

## 2023-04-14 DIAGNOSIS — W500XXA Accidental hit or strike by another person, initial encounter: Secondary | ICD-10-CM | POA: Insufficient documentation

## 2023-04-14 DIAGNOSIS — Y9372 Activity, wrestling: Secondary | ICD-10-CM | POA: Insufficient documentation

## 2023-04-14 DIAGNOSIS — S0181XA Laceration without foreign body of other part of head, initial encounter: Secondary | ICD-10-CM | POA: Diagnosis not present

## 2023-04-14 DIAGNOSIS — Z23 Encounter for immunization: Secondary | ICD-10-CM | POA: Insufficient documentation

## 2023-04-14 DIAGNOSIS — S0993XA Unspecified injury of face, initial encounter: Secondary | ICD-10-CM | POA: Diagnosis present

## 2023-04-14 MED ORDER — LIDOCAINE-EPINEPHRINE 2 %-1:100000 IJ SOLN: 20.0000 mL | Freq: Once | INTRAMUSCULAR | Status: DC

## 2023-04-14 MED ORDER — TETANUS-DIPHTH-ACELL PERTUSSIS 5-2.5-18.5 LF-MCG/0.5 IM SUSY
0.5000 mL | PREFILLED_SYRINGE | Freq: Once | INTRAMUSCULAR | Status: AC
  Administered 2023-04-14: 0.5 mL via INTRAMUSCULAR
  Filled 2023-04-14: qty 0.5

## 2023-04-14 MED ORDER — LIDOCAINE-EPINEPHRINE (PF) 2 %-1:200000 IJ SOLN
INTRAMUSCULAR | Status: AC
  Administered 2023-04-14: 5 mL
  Filled 2023-04-14: qty 20

## 2023-04-14 NOTE — Discharge Instructions (Addendum)
 You were seen today for a laceration of the forehead.  Keep a pressure dressing in place.  Apply antibiotic ointment such as bacitracin.  You need to have stitches removed in 5 to 7 days.  You may go to urgent care or return here for stitch removal.  Take ibuprofen as needed for pain.

## 2023-04-14 NOTE — ED Triage Notes (Signed)
 Pt presents via POV c/o laceration over left eye. Reports sustained during wrestling match. Denies LOC.

## 2023-04-14 NOTE — ED Notes (Signed)
 Discharge instructions reviewed.   Opportunity for questions and concerns provided.   Alert, oriented and ambulatory.   Displays no signs of distress.   Encouraged to follow up in 5-7 days for suture removal.

## 2023-04-14 NOTE — ED Provider Notes (Signed)
 [place] EMERGENCY DEPARTMENT AT [facility] Provider Note   CSN: [id number] Arrival date & time: 04/14/23  1308     History  Chief Complaint  Patient presents with   Laceration    Eric Horton is a 43 y.o. male.  HPI     This is a 43 year old male who presents with a laceration to the forehead.  Patient reports that he was in a wrestling match tonight when he was elbowed in the forehead.  He did not lose consciousness.  He is not on any blood thinners.  No nausea or vomiting.  Laceration was super glued but continued to bleed.  Unknown last tetanus shot.  Home Medications Prior to Admission medications   Not on File      Allergies    Patient has no known allergies.    Review of Systems   Review of Systems  Gastrointestinal:  Negative for nausea and vomiting.  Skin:  Positive for wound.  Neurological:  Positive for headaches.  All other systems reviewed and are negative.   Physical Exam Updated Vital Signs BP 132/87   Pulse 73   Temp 98.1 F (36.7 C) (Oral)   Resp 16   Ht 1.854 m (6\' 1" )   Wt 114.8 kg   SpO2 97%   BMI 33.38 kg/m  Physical Exam Vitals and nursing note reviewed.  Constitutional:      Appearance: He is well-developed.  HENT:     Head: Normocephalic.     Comments: 2 cm gaping laceration to the left forehead above the eye, small underlying hematoma noted    Mouth/Throat:     Mouth: Mucous membranes are moist.  Eyes:     Extraocular Movements: Extraocular movements intact.     Pupils: Pupils are equal, round, and reactive to light.  Cardiovascular:     Rate and Rhythm: Normal rate and regular rhythm.  Pulmonary:     Effort: Pulmonary effort is normal. No respiratory distress.  Abdominal:     Palpations: Abdomen is soft.     Tenderness: There is no abdominal tenderness.  Musculoskeletal:     Cervical back: Neck supple.  Lymphadenopathy:     Cervical: No cervical adenopathy.  Skin:    General: Skin is warm and dry.   Neurological:     Mental Status: He is alert and oriented to person, place, and time.  Psychiatric:        Mood and Affect: Mood normal.    ED Results / Procedures / Treatments   Labs (all labs ordered are listed, but only abnormal results are displayed) Labs Reviewed - No data to display  EKG None  Radiology No results found.  Procedures .Laceration Repair  Date/Time: 04/14/2023 2:37 AM  Performed by: Shon Baton, MD Authorized by: Shon Baton, MD   Consent:    Consent obtained:  Verbal   Consent given by:  Patient   Risks discussed:  Infection, pain and poor cosmetic result   Alternatives discussed:  No treatment Anesthesia:    Anesthesia method:  Local infiltration   Local anesthetic:  Lidocaine 2% WITH epi Laceration details:    Location:  Face   Face location:  Forehead   Length (cm):  2.5   Depth (mm):  5 Pre-procedure details:    Preparation:  Patient was prepped and draped in usual sterile fashion Exploration:    Limited defect created (wound extended): no     Hemostasis achieved with:  Tied off vessels and  direct pressure   Imaging outcome: foreign body not noted     Contaminated: no   Treatment:    Area cleansed with:  Povidone-iodine   Amount of cleaning:  Standard   Irrigation solution:  Sterile saline   Visualized foreign bodies/material removed: no     Debridement:  None   Undermining:  None   Scar revision: no     Layers/structures repaired:  Deep dermal/superficial fascia Deep dermal/superficial fascia:    Suture size:  4-0   Deep dermal/superficial fascia suture material: Rapid Vicryl.   Suture technique:  Simple interrupted   Number of sutures:  1 Skin repair:    Repair method:  Sutures   Suture size:  5-0   Suture material:  Nylon   Suture technique:  Simple interrupted   Number of sutures:  3 Approximation:    Approximation:  Close Repair type:    Repair type:  Intermediate Post-procedure details:    Dressing:   Bulky dressing   Procedure completion:  Tolerated Comments:     After cleaning the wound, patient had a brisk bleeder that required tying off with Vicryl stitch, had some accumulation of hematoma under the laceration with repair.  Pressure dressing was applied.     Medications Ordered in ED Medications  lidocaine-EPINEPHrine (XYLOCAINE W/EPI) 2 %-1:100000 (with pres) injection 20 mL (0 mLs Infiltration Hold 04/14/23 0142)  Tdap (BOOSTRIX) injection 0.5 mL (0.5 mLs Intramuscular Given 04/14/23 0143)  lidocaine-EPINEPHrine (XYLOCAINE W/EPI) 2 %-1:200000 (PF) injection (5 mLs Infiltration Given 04/14/23 0142)    ED Course/ Medical Decision Making/ A&P                                 Medical Decision Making Risk Prescription drug management.   This patient presents to the ED for concern forehead laceration, this involves an extensive number of treatment options, and is a complaint that carries with it a high risk of complications and morbidity.  I considered the following differential and admission for this acute, potentially life threatening condition.  The differential diagnosis includes laceration, hematoma, fracture  MDM:    This is a 43 year old male who presents with forehead laceration.  He is nontoxic and vital signs are reassuring.  Per [place] CT head rules he is low risk.  After cleaning of the wound, he did have a brisk bleeder which was tied off.  He had some ongoing oozing during laceration repair.  For this reason, compressive dressing was applied.  Patient's tetanus was updated.  Do not feel he needs advanced imaging.  (Labs, imaging, consults)  Labs: I Ordered, and personally interpreted labs.  The pertinent results include: None  Imaging Studies ordered: I ordered imaging studies including none I independently visualized and interpreted imaging. I agree with the radiologist interpretation  Additional history obtained from chart review.  External records from outside  source obtained and reviewed including prior evaluations  Cardiac Monitoring: The patient was not maintained on a cardiac monitor.  If on the cardiac monitor, I personally viewed and interpreted the cardiac monitored which showed an underlying rhythm of: N/A  Reevaluation: After the interventions noted above, I reevaluated the patient and found that they have :improved  Social Determinants of Health:  lives independently  Disposition: Discharge  Co morbidities that complicate the patient evaluation History reviewed. No pertinent past medical history.   Medicines Meds ordered this encounter  Medications   lidocaine-EPINEPHrine (XYLOCAINE W/EPI) 2 %-  1:100000 (with pres) injection 20 mL   Tdap (BOOSTRIX) injection 0.5 mL   lidocaine-EPINEPHrine (XYLOCAINE W/EPI) 2 %-1:200000 (PF) injection    Adora Fridge T: cabinet override    I have reviewed the patients home medicines and have made adjustments as needed  Problem List / ED Course: Problem List Items Addressed This Visit   None Visit Diagnoses       Laceration of forehead, initial encounter    -  Primary                   Final Clinical Impression(s) / ED Diagnoses Final diagnoses:  Laceration of forehead, initial encounter    Rx / DC Orders ED Discharge Orders     None         Shon Baton, MD 04/14/23 [phone]
